# Patient Record
Sex: Male | Born: 1970 | Race: White | Hispanic: No | Smoking: Former smoker
Health system: Southern US, Community
[De-identification: ages and names within clinical notes are randomized; demographics above are authoritative.]

## PROBLEM LIST (undated history)

## (undated) HISTORY — PX: ANTERIOR CRUCIATE LIGAMENT REPAIR: SHX115

## (undated) HISTORY — PX: OTHER SURGICAL HISTORY: SHX169

---

## 1997-12-04 ENCOUNTER — Encounter: Payer: Self-pay | Admitting: Emergency Medicine

## 1997-12-04 ENCOUNTER — Emergency Department (HOSPITAL_COMMUNITY): Admission: EM | Admit: 1997-12-04 | Discharge: 1997-12-04 | Payer: Self-pay | Admitting: Emergency Medicine

## 1997-12-05 ENCOUNTER — Encounter: Admission: RE | Admit: 1997-12-05 | Discharge: 1998-02-20 | Payer: Self-pay | Admitting: *Deleted

## 1997-12-23 ENCOUNTER — Encounter: Admission: RE | Admit: 1997-12-23 | Discharge: 1998-02-28 | Payer: Self-pay | Admitting: *Deleted

## 1998-02-20 ENCOUNTER — Encounter: Admission: RE | Admit: 1998-02-20 | Discharge: 1998-05-21 | Payer: Self-pay | Admitting: *Deleted

## 1998-02-27 ENCOUNTER — Ambulatory Visit: Admission: RE | Admit: 1998-02-27 | Discharge: 1998-02-27 | Payer: Self-pay | Admitting: *Deleted

## 1998-02-27 ENCOUNTER — Encounter: Payer: Self-pay | Admitting: *Deleted

## 1998-04-04 ENCOUNTER — Encounter: Payer: Self-pay | Admitting: *Deleted

## 2000-04-12 ENCOUNTER — Emergency Department (HOSPITAL_COMMUNITY): Admission: EM | Admit: 2000-04-12 | Discharge: 2000-04-12 | Payer: Self-pay | Admitting: Emergency Medicine

## 2000-04-12 ENCOUNTER — Encounter: Payer: Self-pay | Admitting: Emergency Medicine

## 2002-10-22 ENCOUNTER — Emergency Department (HOSPITAL_COMMUNITY): Admission: EM | Admit: 2002-10-22 | Discharge: 2002-10-22 | Payer: Self-pay | Admitting: Emergency Medicine

## 2010-08-04 ENCOUNTER — Emergency Department (HOSPITAL_COMMUNITY): Payer: No Typology Code available for payment source

## 2010-08-04 ENCOUNTER — Emergency Department (HOSPITAL_COMMUNITY)
Admission: EM | Admit: 2010-08-04 | Discharge: 2010-08-04 | Disposition: A | Discharge status: Home / Self Care | Payer: No Typology Code available for payment source | Attending: Emergency Medicine | Admitting: Emergency Medicine

## 2010-08-04 DIAGNOSIS — M25519 Pain in unspecified shoulder: Secondary | ICD-10-CM | POA: Insufficient documentation

## 2010-08-04 DIAGNOSIS — S139XXA Sprain of joints and ligaments of unspecified parts of neck, initial encounter: Secondary | ICD-10-CM | POA: Insufficient documentation

## 2010-08-04 DIAGNOSIS — M542 Cervicalgia: Secondary | ICD-10-CM | POA: Insufficient documentation
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
## 2015-09-20 ENCOUNTER — Encounter (HOSPITAL_COMMUNITY): Payer: Self-pay | Admitting: *Deleted

## 2015-09-20 ENCOUNTER — Ambulatory Visit (HOSPITAL_COMMUNITY)
Admission: EM | Admit: 2015-09-20 | Discharge: 2015-09-20 | Disposition: A | Discharge status: Home / Self Care | Payer: BC Managed Care – PPO

## 2015-09-20 DIAGNOSIS — S86112A Strain of other muscle(s) and tendon(s) of posterior muscle group at lower leg level, left leg, initial encounter: Secondary | ICD-10-CM

## 2015-09-20 DIAGNOSIS — S86812A Strain of other muscle(s) and tendon(s) at lower leg level, left leg, initial encounter: Secondary | ICD-10-CM | POA: Diagnosis not present

## 2015-09-20 MED ORDER — IBUPROFEN 800 MG PO TABS: 800.0000 mg | ORAL_TABLET | Freq: Three times a day (TID) | ORAL | 1 refills | Status: AC

## 2015-09-20 NOTE — ED Provider Notes (Signed)
[facility]    CSN: [id number] Arrival date & time: 09/20/15  1657  First Provider Contact:  First MD Initiated Contact with Patient 09/20/15 1756        History   Chief Complaint Chief Complaint  Patient presents with  . Leg Injury    HPI Raymond Peck is a 45 y.o. male.    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 Leg location:  L lower leg Pain details:    Quality:  Sharp   Radiates to:  Does not radiate   Severity:  Mild   Progression:  Unchanged Chronicity:  New Dislocation: no   Foreign body present:  No foreign bodies Prior injury to area:  No Relieved by:  Elevation Worsened by:  Bearing weight Associated symptoms: swelling   Associated symptoms: no back pain, no decreased ROM, no fever and no numbness     History reviewed. No pertinent past medical history.  There are no active problems to display for this patient.   Past Surgical History:  Procedure Laterality Date  . ANTERIOR CRUCIATE LIGAMENT REPAIR    . RIGHT ARM SURGERY         Home Medications    Prior to Admission medications   Medication Sig Start Date End Date Taking? Authorizing Provider  clonazePAM (KLONOPIN) 1 MG tablet Take 1 mg by mouth 2 (two) times daily as needed for anxiety.   Yes Historical Provider, MD    Family History No family history on file.  Social History Social History  Substance Use Topics  . Smoking status: Former Games developer  . Smokeless tobacco: Not on file  . Alcohol use Yes     Comment: occasionally     Allergies   Bee venom   Review of Systems Review of Systems  Constitutional: Negative for fever.  Gastrointestinal: Negative.   Genitourinary: Negative.   Musculoskeletal: Positive for gait problem and myalgias. Negative for back pain and joint swelling.  Skin: Negative.   Neurological: Negative  for numbness.  All other systems reviewed and are negative.    Physical Exam Triage Vital Signs ED Triage Vitals  Enc Vitals Group     BP 09/20/15 1751 135/94     Pulse Rate 09/20/15 1751 82     Resp 09/20/15 1751 16     Temp 09/20/15 1751 98.1 F (36.7 C)     Temp Source 09/20/15 1751 Oral     SpO2 09/20/15 1751 99 %     Weight --      Height --      Head Circumference --      Peak Flow --      Pain Score 09/20/15 1753 7     Pain Loc --      Pain Edu? --      Excl. in GC? --    No data found.   Updated Vital Signs BP 135/94 (BP Location: Right Arm)   Pulse 82   Temp 98.1 F (36.7 C) (Oral)   Resp 16   SpO2 99%   Visual Acuity Right Eye Distance:   Left Eye Distance:   Bilateral Distance:    Right Eye Near:   Left Eye Near:    Bilateral Near:     Physical Exam  Constitutional: He is oriented to person, place, and time. He appears  well-developed and well-nourished. No distress.  Abdominal: Soft. Bowel sounds are normal.  Musculoskeletal: Normal range of motion. He exhibits tenderness.  Sore left calf to palp and rom, distal nvt intact, achilles intact.  Neurological: He is alert and oriented to person, place, and time.  Nursing note and vitals reviewed.    UC Treatments / Results  Labs (all labs ordered are listed, but only abnormal results are displayed) Labs Reviewed - No data to display  EKG  EKG Interpretation None       Radiology No results found.  Procedures Procedures (including critical care time)  Medications Ordered in UC Medications - No data to display   Initial Impression / Assessment and Plan / UC Course  I have reviewed the triage vital signs and the nursing notes.  Pertinent labs & imaging results that were available during my care of the patient were reviewed by me and considered in my medical decision making (see chart for details).  Clinical Course      Final Clinical Impressions(s) / UC Diagnoses   Final  diagnoses:  None    New Prescriptions New Prescriptions   No medications on file     Linna HoffJames D Parul Porcelli, MD 09/20/15 1814

## 2015-09-20 NOTE — Discharge Instructions (Signed)
Heat, crutches and medicine as needed, activity  as tolerated.

## 2015-09-20 NOTE — ED Triage Notes (Signed)
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  C/O pain from left knee down into lower leg, with most significant pain in left calf muscle.  Has been applying ice, elevating, and massaging.  Swelling noted to lower leg into foot.

## 2019-04-14 ENCOUNTER — Ambulatory Visit: Payer: BC Managed Care – PPO | Attending: Internal Medicine

## 2019-04-14 DIAGNOSIS — Z23 Encounter for immunization: Secondary | ICD-10-CM | POA: Insufficient documentation

## 2019-04-14 NOTE — Progress Notes (Signed)
   Covid-19 Vaccination Clinic  Name:  Raymond Peck    MRN: [id number] DOB: [birth year]  04/14/2019  Raymond Peck was observed post Covid-19 immunization for 15 minutes without incident. He was provided with Vaccine Information Sheet and instruction to access the V-Safe system.   Raymond Peck was instructed to call 911 with any severe reactions post vaccine: Marland Kitchen Difficulty breathing  . Swelling of face and throat  . A fast heartbeat  . A bad rash all over body  . Dizziness and weakness   Immunizations Administered    Name Date Dose VIS Date Route   Pfizer COVID-19 Vaccine 04/14/2019  9:17 AM 0.3 mL 01/19/2019 Intramuscular   Manufacturer: [employer], [employer]   Lot: [id number]   NDC: [address]-6

## 2019-05-05 ENCOUNTER — Ambulatory Visit: Payer: BC Managed Care – PPO | Attending: Internal Medicine

## 2019-05-05 DIAGNOSIS — Z23 Encounter for immunization: Secondary | ICD-10-CM

## 2019-05-05 NOTE — Progress Notes (Signed)
   Covid-19 Vaccination Clinic  Name:  Raymond Peck    MRN: [id number] DOB: 1971/02/07  05/05/2019  Mr. Raymond Peck was observed post Covid-19 immunization for 15 minutes without incident. He was provided with Vaccine Information Sheet and instruction to access the V-Safe system.   Mr. Raymond Peck was instructed to call 911 with any severe reactions post vaccine: Marland Kitchen Difficulty breathing  . Swelling of face and throat  . A fast heartbeat  . A bad rash all over body  . Dizziness and weakness   Immunizations Administered    Name Date Dose VIS Date Route   Pfizer COVID-19 Vaccine 05/05/2019  9:01 AM 0.3 mL 01/19/2019 Intramuscular   Manufacturer: [employer], [employer]   Lot: [id number]   NDC: [address]-5

## 2019-07-28 ENCOUNTER — Other Ambulatory Visit: Payer: Self-pay

## 2019-07-28 ENCOUNTER — Encounter (HOSPITAL_COMMUNITY): Payer: Self-pay

## 2019-07-28 ENCOUNTER — Ambulatory Visit (HOSPITAL_COMMUNITY)
Admission: EM | Admit: 2019-07-28 | Discharge: 2019-07-28 | Disposition: A | Discharge status: Home / Self Care | Payer: BC Managed Care – PPO | Attending: Family Medicine | Admitting: Family Medicine

## 2019-07-28 DIAGNOSIS — J029 Acute pharyngitis, unspecified: Secondary | ICD-10-CM

## 2019-07-28 NOTE — ED Triage Notes (Signed)
Pt reports scratchy throat x 1 day. Pt states her daughter test positive for Strep today.

## 2019-07-28 NOTE — ED Provider Notes (Signed)
°  [facility]   [id number] 07/28/19 Arrival Time: 1125  ASSESSMENT & PLAN:  1. Sore throat     No signs of peritonsillar abscess or strep throat. He does not have tonsils.  Comfortable with observation and OTC tx as needed. Likely post-nasal related.  Reviewed expectations re: course of current medical issues. Questions answered. Outlined signs and symptoms indicating need for more acute intervention. Patient verbalized understanding. After Visit Summary given.   SUBJECTIVE:  Raymond Peck is a 49 y.o. male who reports a sore throat. He does not have tonsils. Daughter with strep. He questions some post-nasal drainage. Afebrile. No neck pain or swelling. No associated nausea, vomiting, or abdominal pain. OTC treatment: none.    OBJECTIVE:  Vitals:   07/28/19 1225  BP: 132/90  Pulse: 62  Resp: 18  Temp: 98.5 F (36.9 C)  TempSrc: Oral  SpO2: 98%     General appearance: alert; no distress HEENT: throat with mild erythema and cobblestoning; uvula is midline Neck: supple with FROM; no lymphadenopathy CV: RRR Lungs: clear to auscultation bilaterally Abd: soft; non-tender Skin: reveals no rash; warm and dry Psychological: alert and cooperative; normal mood and affect  Allergies  Allergen Reactions   Bee Venom     History reviewed. No pertinent past medical history. Social History   Socioeconomic History   Marital status: Significant Other    Spouse name: Not on file   Number of children: Not on file   Years of education: Not on file   Highest education level: Not on file  Occupational History   Not on file  Tobacco Use   Smoking status: Former Smoker   Smokeless tobacco: Never Used  Substance and Sexual Activity   Alcohol use: Yes    Comment: occasionally   Drug use: No   Sexual activity: Not on file  Other Topics Concern   Not on file  Social History Narrative   Not on file   Social Determinants of Health   Financial  Resource Strain:    Difficulty of Paying Living Expenses:   Food Insecurity:    Worried About Programme researcher, broadcasting/film/video in the Last Year:    Barista in the Last Year:   Transportation Needs:    Freight forwarder (Medical):    Lack of Transportation (Non-Medical):   Physical Activity:    Days of Exercise per Week:    Minutes of Exercise per Session:   Stress:    Feeling of Stress :   Social Connections:    Frequency of Communication with Friends and Family:    Frequency of Social Gatherings with Friends and Family:    Attends Religious Services:    Active Member of Clubs or Organizations:    Attends Engineer, structural:    Marital Status:   Intimate Partner Violence:    Fear of Current or Ex-Partner:    Emotionally Abused:    Physically Abused:    Sexually Abused:    History reviewed. No pertinent family history.        Mardella Layman, MD 07/28/19 1242
